# Patient Record
Sex: Male | Born: 1995 | Race: White | Hispanic: No | Marital: Single | Smoking: Current every day smoker
Health system: Southern US, Community
[De-identification: ages and names within clinical notes are randomized; demographics above are authoritative.]

## PROBLEM LIST (undated history)

## (undated) DIAGNOSIS — J45909 Unspecified asthma, uncomplicated: Secondary | ICD-10-CM

## (undated) HISTORY — PX: WRIST SURGERY: SHX841

---

## 2018-11-27 ENCOUNTER — Emergency Department: Payer: BLUE CROSS/BLUE SHIELD

## 2018-11-27 ENCOUNTER — Emergency Department
Admission: EM | Admit: 2018-11-27 | Discharge: 2018-11-28 | Disposition: A | Discharge status: Home / Self Care | Payer: BLUE CROSS/BLUE SHIELD | Attending: Student in an Organized Health Care Education/Training Program | Admitting: Student in an Organized Health Care Education/Training Program

## 2018-11-27 ENCOUNTER — Other Ambulatory Visit: Payer: Self-pay

## 2018-11-27 DIAGNOSIS — T40601A Poisoning by unspecified narcotics, accidental (unintentional), initial encounter: Secondary | ICD-10-CM | POA: Diagnosis not present

## 2018-11-27 DIAGNOSIS — F17210 Nicotine dependence, cigarettes, uncomplicated: Secondary | ICD-10-CM | POA: Insufficient documentation

## 2018-11-27 DIAGNOSIS — T40604A Poisoning by unspecified narcotics, undetermined, initial encounter: Secondary | ICD-10-CM

## 2018-11-27 LAB — COMPREHENSIVE METABOLIC PANEL
ALT: 101 U/L — AB (ref 0–44)
AST: 90 U/L — ABNORMAL HIGH (ref 15–41)
Albumin: 4.3 g/dL (ref 3.5–5.0)
Alkaline Phosphatase: 65 U/L (ref 38–126)
Anion gap: 10 (ref 5–15)
BUN: 22 mg/dL — ABNORMAL HIGH (ref 6–20)
CHLORIDE: 100 mmol/L (ref 98–111)
CO2: 27 mmol/L (ref 22–32)
Calcium: 8.7 mg/dL — ABNORMAL LOW (ref 8.9–10.3)
Creatinine, Ser: 2.03 mg/dL — ABNORMAL HIGH (ref 0.61–1.24)
GFR calc Af Amer: 52 mL/min — ABNORMAL LOW (ref >60)
GFR calc non Af Amer: 45 mL/min — ABNORMAL LOW (ref >60)
Glucose, Bld: 211 mg/dL — ABNORMAL HIGH (ref 70–99)
Potassium: 3.5 mmol/L (ref 3.5–5.1)
Sodium: 137 mmol/L (ref 135–145)
Total Bilirubin: 0.6 mg/dL (ref 0.3–1.2)
Total Protein: 7.2 g/dL (ref 6.5–8.1)

## 2018-11-27 LAB — CBC WITH DIFFERENTIAL/PLATELET
Abs Immature Granulocytes: 0.02 10*3/uL (ref 0.00–0.07)
Basophils Absolute: 0 10*3/uL (ref 0.0–0.1)
Basophils Relative: 1 %
Eosinophils Absolute: 0.2 10*3/uL (ref 0.0–0.5)
Eosinophils Relative: 3 %
HCT: 40.4 % (ref 39.0–52.0)
Hemoglobin: 13.9 g/dL (ref 13.0–17.0)
Immature Granulocytes: 0 %
LYMPHS ABS: 2.1 10*3/uL (ref 0.7–4.0)
Lymphocytes Relative: 30 %
MCH: 31.7 pg (ref 26.0–34.0)
MCHC: 34.4 g/dL (ref 30.0–36.0)
MCV: 92 fL (ref 80.0–100.0)
Monocytes Absolute: 0.7 10*3/uL (ref 0.1–1.0)
Monocytes Relative: 9 %
NRBC: 0 % (ref 0.0–0.2)
Neutro Abs: 3.9 10*3/uL (ref 1.7–7.7)
Neutrophils Relative %: 57 %
Platelets: 263 10*3/uL (ref 150–400)
RBC: 4.39 MIL/uL (ref 4.22–5.81)
RDW: 11.9 % (ref 11.5–15.5)
WBC: 6.9 10*3/uL (ref 4.0–10.5)

## 2018-11-27 LAB — URINE DRUG SCREEN, QUALITATIVE (ARMC ONLY)
Amphetamines, Ur Screen: NOT DETECTED
Barbiturates, Ur Screen: NOT DETECTED
Benzodiazepine, Ur Scrn: NOT DETECTED
CANNABINOID 50 NG, UR ~~LOC~~: NOT DETECTED
COCAINE METABOLITE, UR ~~LOC~~: POSITIVE — AB
MDMA (ECSTASY) UR SCREEN: NOT DETECTED
Methadone Scn, Ur: NOT DETECTED
Opiate, Ur Screen: POSITIVE — AB
Phencyclidine (PCP) Ur S: NOT DETECTED
Tricyclic, Ur Screen: NOT DETECTED

## 2018-11-27 LAB — ETHANOL: Alcohol, Ethyl (B): <10 mg/dL (ref <10)

## 2018-11-27 LAB — SALICYLATE LEVEL

## 2018-11-27 LAB — ACETAMINOPHEN LEVEL: Acetaminophen (Tylenol), Serum: <10 ug/mL — ABNORMAL LOW (ref 10–30)

## 2018-11-27 MED ORDER — NALOXONE HCL 2 MG/2ML IJ SOSY
PREFILLED_SYRINGE | INTRAMUSCULAR | Status: AC
  Administered 2018-11-27: 0.4 mg via INTRAVENOUS
  Filled 2018-11-27: qty 2

## 2018-11-27 MED ORDER — SODIUM CHLORIDE 0.9 % IV BOLUS
1000.0000 mL | Freq: Once | INTRAVENOUS | Status: AC
  Administered 2018-11-27: 1000 mL via INTRAVENOUS

## 2018-11-27 MED ORDER — NALOXONE HCL 2 MG/2ML IJ SOSY
0.4000 mg | PREFILLED_SYRINGE | Freq: Once | INTRAMUSCULAR | Status: AC
  Administered 2018-11-27: 0.4 mg via INTRAVENOUS

## 2018-11-27 NOTE — ED Notes (Signed)
Girlfriend stated that pt got out of rehab in November 2019.

## 2018-11-27 NOTE — ED Notes (Signed)
Pt was placed on 4 liters [place] and O2 is 99%

## 2018-11-27 NOTE — ED Triage Notes (Signed)
EMS stated that when the arrival pt was laying on floor with agonal breathing. EMS admin 0.5 mg of narcan and pt become more awake. EMS stated that gf roommate stated that she hear pt breathing weird in the bedroom and she went into the room and he was not responding to her so she pulled him into the living room. Pt has hx of drug abuse and he keeps saying that he does not remember anything and does not remember taking any drugs. 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 Pt keeps asking the same questions over and over.

## 2018-11-27 NOTE — ED Notes (Signed)
Provider made aware that pt was asleep and oxygen level dropped to 83-86%. Pt was place back on 2 liters and provider stated that give pt narcan.

## 2018-11-27 NOTE — ED Notes (Signed)
Girlfriend is at bedside.
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
## 2018-11-27 NOTE — ED Notes (Signed)
On arrival to ED, pt is sitting up and is talking. Pt keeps repeating himself and asking the same questions. Pt denies any drug usage pt has hx of drug abuse.
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
## 2018-11-27 NOTE — ED Notes (Signed)
Pt taken off of O2 per provider.

## 2018-11-28 NOTE — ED Notes (Signed)
Hourly rounding reveals patient in room. No complaints, stable, in no acute distress. Q15 minute rounds and monitoring via [place] and Officer to continue.   

## 2018-11-28 NOTE — ED Notes (Signed)
Report to include Situation, Background, Assessment, and Recommendations received from [place] RN. Patient alert and oriented, warm and dry, in no acute distress. Patient denies SI, HI, AVH and pain. Patient made aware of Q15 minute rounds and Psychologist, counselling presence for their safety. Patient instructed to come to me with needs or concerns.

## 2018-11-28 NOTE — ED Notes (Signed)
Pt is resting in bed. Girlfriend at bedside.

## 2018-11-28 NOTE — ED Notes (Signed)
Pt is undressed and placed in purple paper scrubs. Pt gave all of his belongings to his girlfriend to take him. Tom, RN witnessed this as well.

## 2018-11-28 NOTE — ED Notes (Addendum)
I awakened him and informed him that he is discharged and he may call for a ride home  - he made a phone call and reported to me that his ride will be bringing his belongings from home so that he may get dressed  - apparently all belongings were sent home with family     Pt reports  "My girlfriend is on her way with me some clothes"

## 2018-11-28 NOTE — ED Notes (Signed)
I awakened him again to see if his transportation is enroute  - I gave him the phone to use again  Continue to monitor

## 2018-11-28 NOTE — ED Notes (Signed)

## 2018-11-28 NOTE — ED Notes (Signed)
ED  Is the patient under IVC or is there intent for IVC:  No rescinded    Is the patient medically cleared: Yes.   Is there vacancy in the ED BHU: Yes.   Is the population mix appropriate for patient: Yes.   Is the patient awaiting placement in inpatient or outpatient setting: pt to discharge   Has the patient had a psychiatric consult: Yes.   SOC Survey of unit performed for contraband, proper placement and condition of furniture, tampering with fixtures in bathroom, shower, and each patient room: Yes.  ; Findings:  APPEARANCE/BEHAVIOR Calm and cooperative NEURO ASSESSMENT Orientation: oriented x3  Denies pain Hallucinations: No.None noted (Hallucinations)  Denies  Speech: Normal Gait: normal RESPIRATORY ASSESSMENT Even  Unlabored respirations  CARDIOVASCULAR ASSESSMENT Pulses equal   regular rate  Skin warm and dry   GASTROINTESTINAL ASSESSMENT no GI complaint EXTREMITIES Full ROM  PLAN OF CARE Provide calm/safe environment. Vital signs assessed twice daily. ED BHU Assessment once each 12-hour shift. Collaborate with TTS daily or as condition indicates. Assure the ED provider has rounded once each shift. Provide and encourage hygiene. Provide redirection as needed. Assess for escalating behavior; address immediately and inform ED provider.  Assess family dynamic and appropriateness for visitation as needed: Yes.  ; If necessary, describe findings:  Educate the patient/family about BHU procedures/visitation: Yes.  ; If necessary, describe findings:

## 2018-11-28 NOTE — Discharge Instructions (Addendum)
Do not use illicit drugs.  Return to the emergency department if you develop thoughts of hurting yourself or anyone else, hallucinations, or any other symptoms concerning to you.

## 2018-11-28 NOTE — ED Notes (Signed)
Patient observed lying in bed with eyes closed  Even, unlabored respirations observed   NAD pt appears to be sleeping  I will continue to monitor along with every 15 minute visual observations and ongoing security monitoring    

## 2018-11-28 NOTE — ED Notes (Addendum)
I entered his room - he is sleeping soundly at this time  I will await someone to pick him up   Patient sleeping: Yes.   Patient alert and oriented: eyes closed  Appears asleep Behavior appropriate: Yes.  ; If no, describe:  Nutrition and fluids offered: Yes  Toileting and hygiene offered: sleeping Sitter present: q 15 minute observations and security monitoring Law enforcement present: yes  ODS

## 2018-11-28 NOTE — ED Provider Notes (Signed)
-----------------------------------------   4:13 AM on 11/28/2018 -----------------------------------------  Patient awake, alert and moved to the psychiatric side of the ED.  Pending psychiatry evaluation.   Irean HongSung, Jade J, MD 11/28/18 [phone]

## 2018-12-30 ENCOUNTER — Encounter: Payer: Self-pay | Admitting: Emergency Medicine

## 2018-12-30 ENCOUNTER — Emergency Department
Admission: EM | Admit: 2018-12-30 | Discharge: 2018-12-30 | Disposition: A | Discharge status: Home / Self Care | Payer: BLUE CROSS/BLUE SHIELD | Attending: Emergency Medicine | Admitting: Emergency Medicine

## 2018-12-30 ENCOUNTER — Other Ambulatory Visit: Payer: Self-pay

## 2018-12-30 DIAGNOSIS — Y9389 Activity, other specified: Secondary | ICD-10-CM | POA: Insufficient documentation

## 2018-12-30 DIAGNOSIS — J45909 Unspecified asthma, uncomplicated: Secondary | ICD-10-CM | POA: Diagnosis not present

## 2018-12-30 DIAGNOSIS — Y92410 Unspecified street and highway as the place of occurrence of the external cause: Secondary | ICD-10-CM | POA: Diagnosis not present

## 2018-12-30 DIAGNOSIS — S161XXA Strain of muscle, fascia and tendon at neck level, initial encounter: Secondary | ICD-10-CM | POA: Insufficient documentation

## 2018-12-30 DIAGNOSIS — S199XXA Unspecified injury of neck, initial encounter: Secondary | ICD-10-CM | POA: Diagnosis present

## 2018-12-30 DIAGNOSIS — Y998 Other external cause status: Secondary | ICD-10-CM | POA: Insufficient documentation

## 2018-12-30 DIAGNOSIS — G44319 Acute post-traumatic headache, not intractable: Secondary | ICD-10-CM

## 2018-12-30 DIAGNOSIS — G44309 Post-traumatic headache, unspecified, not intractable: Secondary | ICD-10-CM | POA: Insufficient documentation

## 2018-12-30 DIAGNOSIS — F17200 Nicotine dependence, unspecified, uncomplicated: Secondary | ICD-10-CM | POA: Insufficient documentation

## 2018-12-30 HISTORY — DX: Unspecified asthma, uncomplicated: J45.909

## 2018-12-30 MED ORDER — ORPHENADRINE CITRATE 30 MG/ML IJ SOLN
60.0000 mg | Freq: Once | INTRAMUSCULAR | Status: DC
  Filled 2018-12-30: qty 2

## 2018-12-30 MED ORDER — METHOCARBAMOL 500 MG PO TABS: 500.0000 mg | ORAL_TABLET | Freq: Four times a day (QID) | ORAL | 0 refills | Status: DC

## 2018-12-30 MED ORDER — ORPHENADRINE CITRATE 30 MG/ML IJ SOLN
60.0000 mg | Freq: Once | INTRAMUSCULAR | Status: AC
  Administered 2018-12-30: 60 mg via INTRAVENOUS

## 2018-12-30 MED ORDER — KETOROLAC TROMETHAMINE 30 MG/ML IJ SOLN
30.0000 mg | Freq: Once | INTRAMUSCULAR | Status: AC
  Administered 2018-12-30: 30 mg via INTRAVENOUS

## 2018-12-30 MED ORDER — KETOROLAC TROMETHAMINE 30 MG/ML IJ SOLN
30.0000 mg | Freq: Once | INTRAMUSCULAR | Status: DC
  Filled 2018-12-30: qty 1

## 2018-12-30 MED ORDER — PROMETHAZINE HCL 25 MG/ML IJ SOLN
25.0000 mg | Freq: Once | INTRAMUSCULAR | Status: DC
  Filled 2018-12-30: qty 1

## 2018-12-30 MED ORDER — PROMETHAZINE HCL 25 MG/ML IJ SOLN
25.0000 mg | Freq: Once | INTRAMUSCULAR | Status: AC
  Administered 2018-12-30: 25 mg via INTRAVENOUS

## 2018-12-30 MED ORDER — MELOXICAM 15 MG PO TABS: 15.0000 mg | ORAL_TABLET | Freq: Every day | ORAL | 0 refills | Status: DC
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
## 2019-11-03 ENCOUNTER — Ambulatory Visit: Payer: BC Managed Care – PPO | Attending: Internal Medicine

## 2019-11-03 DIAGNOSIS — Z20822 Contact with and (suspected) exposure to covid-19: Secondary | ICD-10-CM

## 2019-11-04 LAB — NOVEL CORONAVIRUS, NAA: SARS-CoV-2, NAA: NOT DETECTED

## 2019-11-25 ENCOUNTER — Encounter: Payer: Self-pay | Admitting: Emergency Medicine

## 2019-11-25 ENCOUNTER — Ambulatory Visit
Admission: EM | Admit: 2019-11-25 | Discharge: 2019-11-25 | Disposition: A | Discharge status: Home / Self Care | Payer: BC Managed Care – PPO | Attending: Family Medicine | Admitting: Family Medicine

## 2019-11-25 ENCOUNTER — Ambulatory Visit (INDEPENDENT_AMBULATORY_CARE_PROVIDER_SITE_OTHER): Payer: BC Managed Care – PPO

## 2019-11-25 ENCOUNTER — Other Ambulatory Visit: Payer: Self-pay

## 2019-11-25 DIAGNOSIS — M79671 Pain in right foot: Secondary | ICD-10-CM

## 2019-11-25 DIAGNOSIS — M25571 Pain in right ankle and joints of right foot: Secondary | ICD-10-CM | POA: Diagnosis not present

## 2019-11-25 DIAGNOSIS — W109XXA Fall (on) (from) unspecified stairs and steps, initial encounter: Secondary | ICD-10-CM

## 2019-11-25 DIAGNOSIS — S93401A Sprain of unspecified ligament of right ankle, initial encounter: Secondary | ICD-10-CM | POA: Diagnosis not present

## 2019-11-25 MED ORDER — MELOXICAM 15 MG PO TABS: 15.0000 mg | ORAL_TABLET | Freq: Every day | ORAL | 0 refills | Status: AC | PRN

## 2019-11-25 NOTE — ED Triage Notes (Signed)
Patient states he fell down some stairs yesterday injuring his right ankle and top of his foot. Patient reports bruising, pain and swelling.

## 2019-11-25 NOTE — ED Provider Notes (Signed)
[facility]    CSN: [id number] Arrival date & time: 11/25/19  1326  History   Chief Complaint Chief Complaint  Patient presents with  . Ankle Injury   HPI   24 year old male presents with the above complaint.  Patient states that he fell down approximately 4 steps yesterday.  In doing so he injured his right foot and ankle.  He reports extensive swelling and bruising particularly on the lateral aspect of his foot and ankle.  He states that he initially had difficulty ambulating and had significant pain.  He states that today he seems to be improved.  He rates his pain currently as 4/10 in severity.  He is concerned that he may have fractured his foot or ankle.  He is requesting x-rays today.  He is currently able to ambulate.  No other reported symptoms.  No other complaints.  Past Medical History:  Diagnosis Date  . Asthma    Past Surgical History:  Procedure Laterality Date  . WRIST SURGERY      Home Medications    Prior to Admission medications   Medication Sig Start Date End Date Taking? Authorizing Provider  meloxicam (MOBIC) 15 MG tablet Take 1 tablet (15 mg total) by mouth daily as needed for pain. 11/25/19   Coral Spikes, DO   Social History Social History   Tobacco Use  . Smoking status: Current Every Day Smoker  . Smokeless tobacco: Never Used  Substance Use Topics  . Alcohol use: Yes  . Drug use: Never     Allergies   Patient has no known allergies.   Review of Systems Review of Systems  Constitutional: Negative.   Musculoskeletal:       Right foot and ankle pain, swelling, bruising.   Physical Exam Triage Vital Signs ED Triage Vitals  Enc Vitals Group     BP 11/25/19 1411 120/64     Pulse Rate 11/25/19 1411 64     Resp 11/25/19 1411 18     Temp 11/25/19 1411 98.2 F (36.8 C)     Temp Source 11/25/19 1411 Oral     SpO2 11/25/19 1411 98 %     Weight 11/25/19 1409 220 lb (99.8 kg)     Height 11/25/19 1409 6' (1.829 m)     Head  Circumference --      Peak Flow --      Pain Score 11/25/19 1408 4     Pain Loc --      Pain Edu? --      Excl. in [place]? --    Updated Vital Signs BP 120/64 (BP Location: Right Arm)   Pulse 64   Temp 98.2 F (36.8 C) (Oral)   Resp 18   Ht 6' (1.829 m)   Wt 99.8 kg   SpO2 98%   BMI 29.84 kg/m   Visual Acuity Right Eye Distance:   Left Eye Distance:   Bilateral Distance:    Right Eye Near:   Left Eye Near:    Bilateral Near:     Physical Exam Vitals and nursing note reviewed.  Constitutional:      General: He is not in acute distress.    Appearance: Normal appearance. He is not ill-appearing.  HENT:     Head: Normocephalic and atraumatic.  Eyes:     General:        Right eye: No discharge.        Left eye: No discharge.     Conjunctiva/sclera:  Conjunctivae normal.  Cardiovascular:     Rate and Rhythm: Normal rate and regular rhythm.  Pulmonary:     Effort: Pulmonary effort is normal.     Breath sounds: Normal breath sounds. No wheezing, rhonchi or rales.  Musculoskeletal:     Comments: Right foot and ankle -tenderness over the dorsum of the right foot and also tenderness over the lateral malleolus.  Extensive bruising noted.  Swelling noted as well.  Neurological:     Mental Status: He is alert.  Psychiatric:        Mood and Affect: Mood normal.        Behavior: Behavior normal.    UC Treatments / Results  Labs (all labs ordered are listed, but only abnormal results are displayed) Labs Reviewed - No data to display  EKG   Radiology DG Ankle Complete Right  Result Date: 11/25/2019 CLINICAL DATA:  Fall with injury. EXAM: RIGHT ANKLE - COMPLETE 3+ VIEW COMPARISON:  None. FINDINGS: No evidence of an acute fracture. No subluxation or dislocation. Ankle mortise is preserved. Lateral soft tissue swelling evident. IMPRESSION: Soft tissue swelling without evidence for acute bony abnormality. Electronically Signed   By: Kennith Center M.D.   On: 11/25/2019 14:42    DG Foot Complete Right  Result Date: 11/25/2019 CLINICAL DATA:  Fall with injury. EXAM: RIGHT FOOT COMPLETE - 3+ VIEW COMPARISON:  None. FINDINGS: There is no evidence of fracture or dislocation. There is no evidence of arthropathy or other focal bone abnormality. Soft tissues are unremarkable. IMPRESSION: Negative. Electronically Signed   By: Kennith Center M.D.   On: 11/25/2019 14:42    Procedures Procedures (including critical care time)  Medications Ordered in UC Medications - No data to display  Initial Impression / Assessment and Plan / UC Course  I have reviewed the triage vital signs and the nursing notes.  Pertinent labs & imaging results that were available during my care of the patient were reviewed by me and considered in my medical decision making (see chart for details).    24 year old male presents with right ankle sprain.  X-rays negative.  Advised rest, ice, elevation.  Meloxicam as directed.  Supportive care.  Final Clinical Impressions(s) / UC Diagnoses   Final diagnoses:  Sprain of right ankle, unspecified ligament, initial encounter   Discharge Instructions   None    ED Prescriptions    Medication Sig Dispense Auth. Provider   meloxicam (MOBIC) 15 MG tablet Take 1 tablet (15 mg total) by mouth daily as needed for pain. 14 tablet Tommie Sams, DO     PDMP not reviewed this encounter.   Tommie Sams, [place] 11/25/19 1612

## 2020-03-04 ENCOUNTER — Ambulatory Visit: Payer: Self-pay | Admitting: Physician Assistant

## 2020-03-04 ENCOUNTER — Ambulatory Visit: Payer: BC Managed Care – PPO

## 2020-03-04 ENCOUNTER — Other Ambulatory Visit: Payer: Self-pay

## 2020-03-04 ENCOUNTER — Encounter: Payer: Self-pay | Admitting: Physician Assistant

## 2020-03-04 DIAGNOSIS — Z113 Encounter for screening for infections with a predominantly sexual mode of transmission: Secondary | ICD-10-CM

## 2020-03-04 DIAGNOSIS — Z202 Contact with and (suspected) exposure to infections with a predominantly sexual mode of transmission: Secondary | ICD-10-CM

## 2020-03-04 MED ORDER — AZITHROMYCIN 500 MG PO TABS
1000.0000 mg | ORAL_TABLET | Freq: Once | ORAL | Status: AC
  Administered 2020-03-04: 1000 mg via ORAL

## 2020-03-04 NOTE — Progress Notes (Signed)
   [facility] Department STI clinic/screening visit  Subjective:  Christopher Durham is a 24 y.o. male being seen today for an STI screening visit. The patient reports they do not have symptoms.    Patient has the following medical conditions:  There are no problems to display for this patient.    Chief Complaint  Patient presents with  . SEXUALLY TRANSMITTED DISEASE    screening    HPI  Patient reports that he does not have symptoms but is a contact to Chlamydia.  Reports that he has environmental allergies and is taking some new allergy medicines that he started yesterday after his testing.  States that he does not know the names of them yet.  Reports h/o substance abuse but has been clean since 2017.  Reports that he is not sure whether he has had Hep A or Hep B series or previous testing for Hep A, B or C.   See flowsheet for further details and programmatic requirements.    The following portions of the patient's history were reviewed and updated as appropriate: allergies, current medications, past medical history, past social history, past surgical history and problem list.  Objective:  There were no vitals filed for this visit.  Physical Exam Constitutional:      General: He is not in acute distress.    Appearance: Normal appearance.  HENT:     Head: Normocephalic and atraumatic.     Comments: No nits, lice or hair loss. No cervical, supraclavicular or axillary adenopathy. Eyes:     Conjunctiva/sclera: Conjunctivae normal.  Pulmonary:     Effort: Pulmonary effort is normal.  Neurological:     Mental Status: He is alert and oriented to person, place, and time.  Psychiatric:        Mood and Affect: Mood normal.        Behavior: Behavior normal.        Thought Content: Thought content normal.        Judgment: Judgment normal.       Assessment and Plan:  Christopher Durham is a 24 y.o. male presenting to the [facility] Department for STI  screening  1. Screening for STD (sexually transmitted disease) Patient into clinic without symptoms.  Declines screening exam with GC culture and Gram stain due to time constraints. Rec condoms with all sex. Await test results.  Counseled that RN will call if needs to RTC for treatment once results are back.  - HIV/HCV [facility] - Syphilis Serology,  Lab - HBV Antigen/Antibody State Lab  2. Chlamydia contact Will treat with Azithromycin 1 g po DOT today. No sex for 7 days and until after partner completes treatment. RTC for re-treatment if vomits < 2 hr after taking medicine. - azithromycin (ZITHROMAX) tablet 1,000 mg     No follow-ups on file.  No future appointments.  Matt Holmes, PA

## 2020-03-10 LAB — HEPATITIS B SURFACE ANTIGEN

## 2020-03-14 LAB — HM HIV SCREENING LAB: HM HIV Screening: NEGATIVE

## 2020-03-14 LAB — HM HEPATITIS C SCREENING LAB: HM Hepatitis Screen: NEGATIVE

## 2020-07-25 ENCOUNTER — Other Ambulatory Visit: Payer: Self-pay

## 2021-06-28 IMAGING — CR DG ANKLE COMPLETE 3+V*R*
3 series · 3 of 3 positions shown · non-contrast
Comparison: None.

CLINICAL DATA: Fall with injury.

EXAM:
RIGHT ANKLE - COMPLETE 3+ VIEW

[ankle obl]
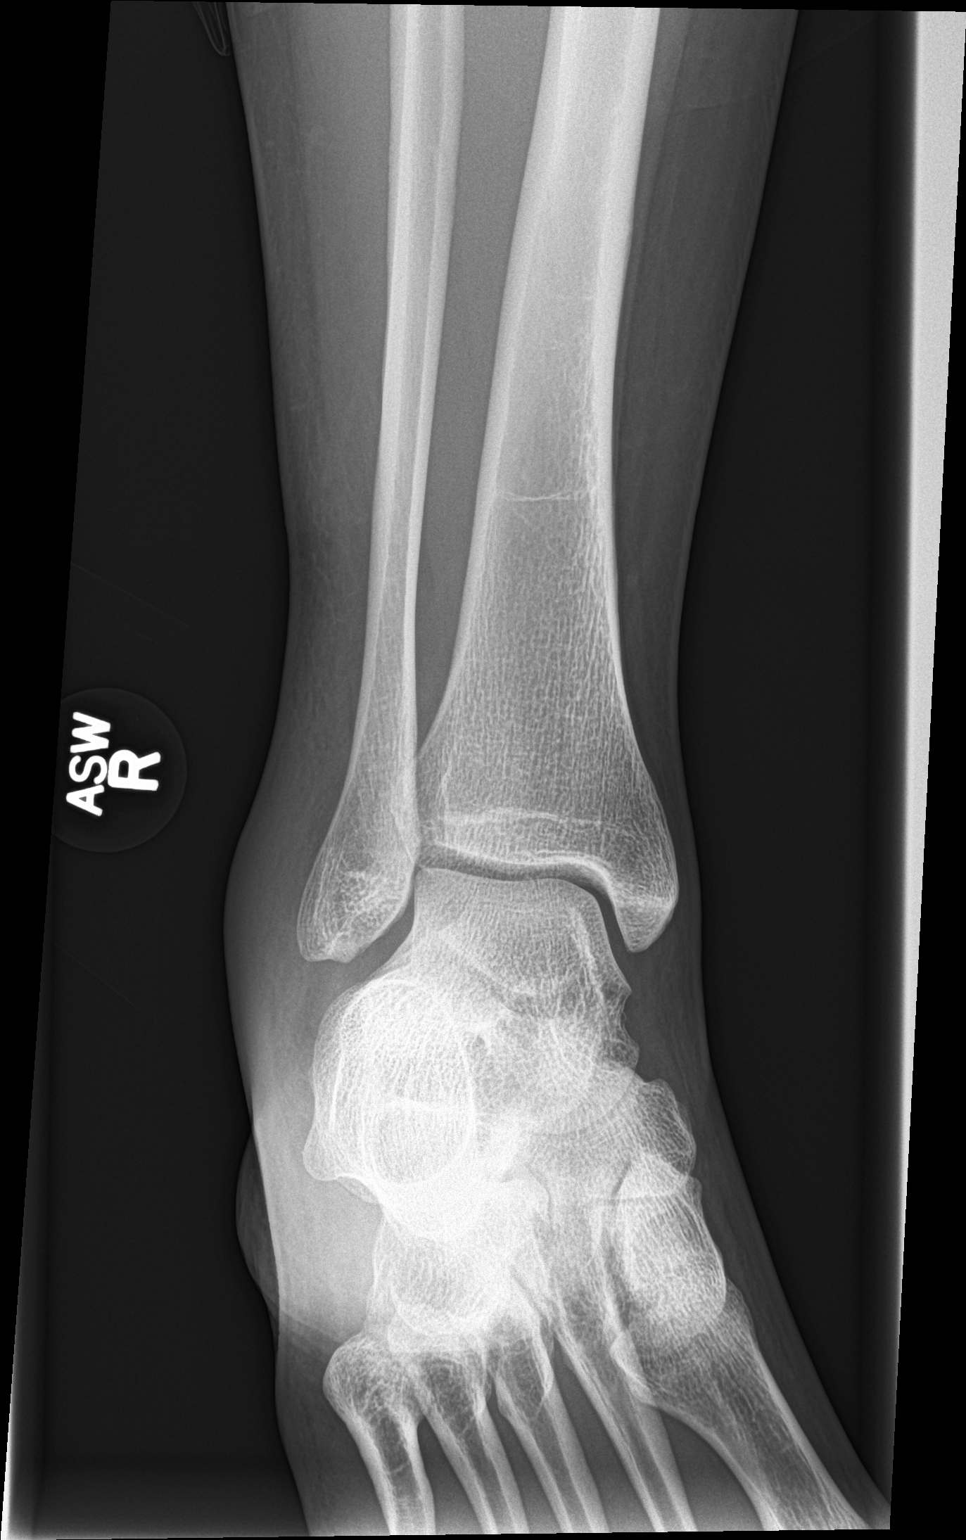

[ankle lat]
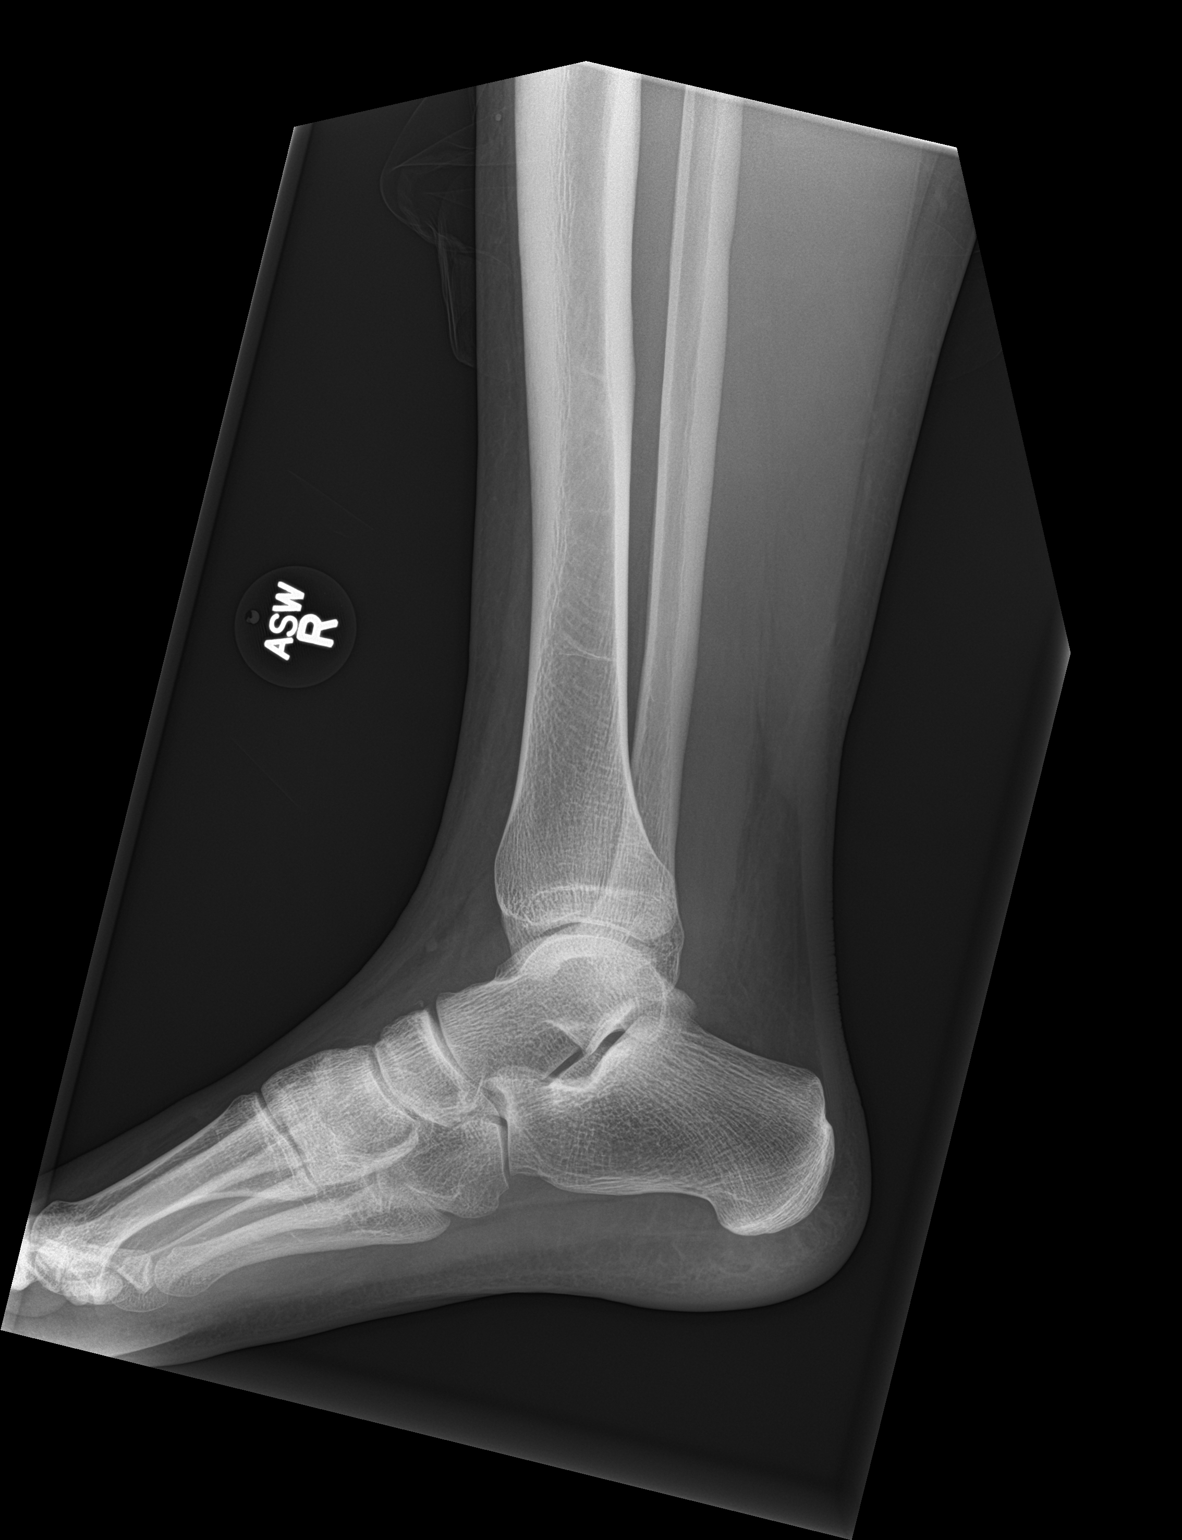

[ankle ap]
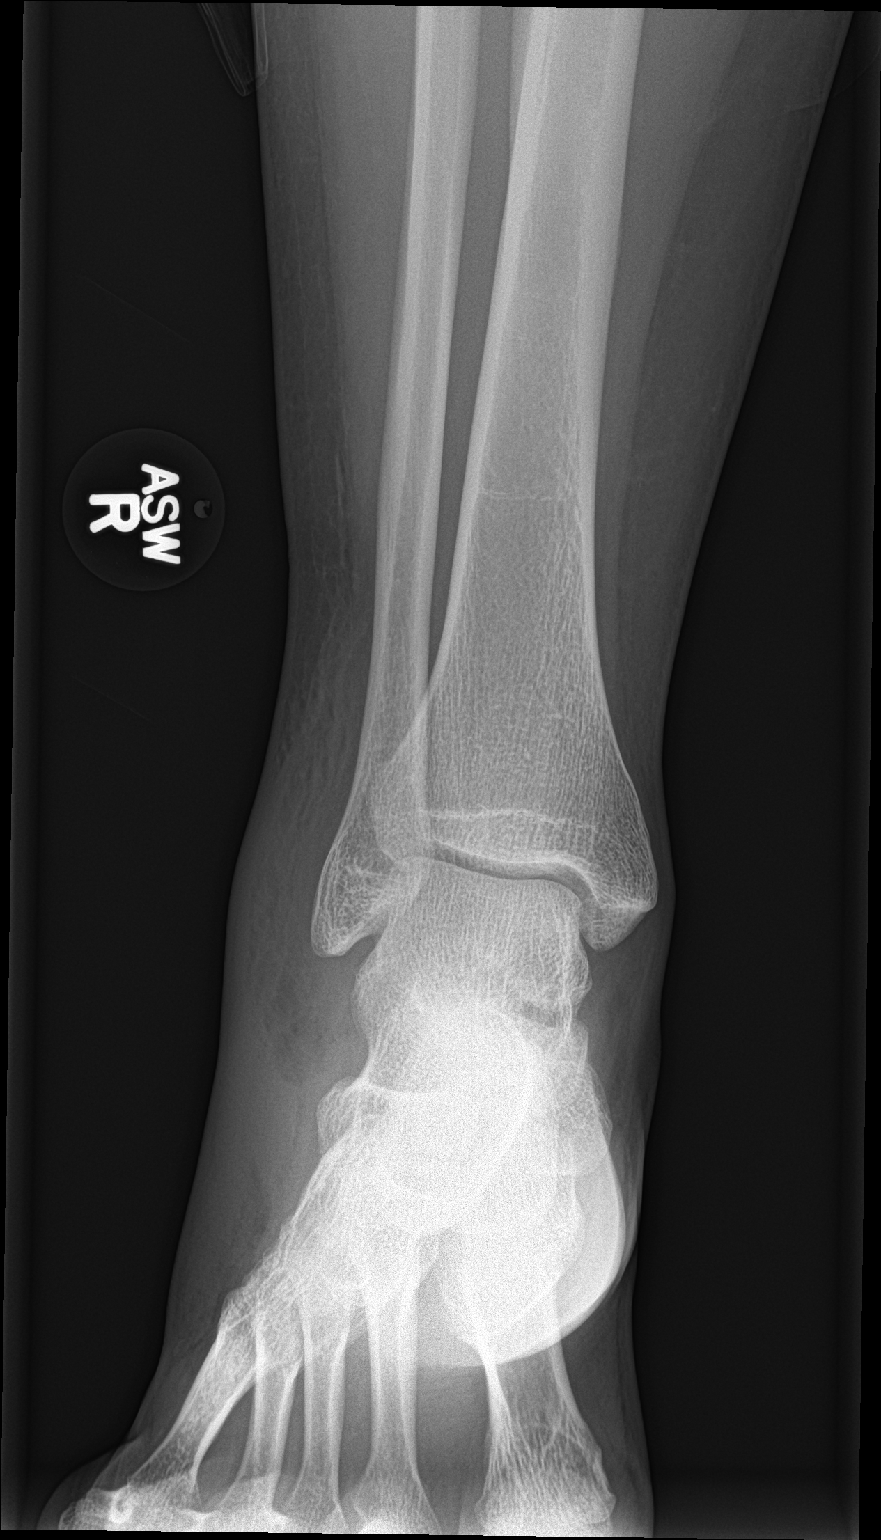

[3 of 3 positions shown; findings below may reference images not displayed]

FINDINGS: No evidence of an acute fracture. No subluxation or dislocation.
Ankle mortise is preserved. Lateral soft tissue swelling evident.
IMPRESSION: Soft tissue swelling without evidence for acute bony abnormality.
